# Patient Record
Sex: Male | Born: 1985 | Race: White | Hispanic: No | Marital: Single | Smoking: Never smoker
Health system: Southern US, Community
[De-identification: ages and names within clinical notes are randomized; demographics above are authoritative.]

## PROBLEM LIST (undated history)

## (undated) HISTORY — PX: NO PAST SURGERIES: SHX2092

---

## 2007-04-23 ENCOUNTER — Ambulatory Visit: Payer: Self-pay | Admitting: Internal Medicine

## 2017-04-20 ENCOUNTER — Ambulatory Visit
Admission: EM | Admit: 2017-04-20 | Discharge: 2017-04-20 | Disposition: A | Discharge status: Home / Self Care | Payer: Self-pay | Attending: Family Medicine | Admitting: Family Medicine

## 2017-04-20 DIAGNOSIS — M25512 Pain in left shoulder: Secondary | ICD-10-CM

## 2017-04-20 MED ORDER — KETOROLAC TROMETHAMINE 60 MG/2ML IM SOLN
60.0000 mg | Freq: Once | INTRAMUSCULAR | Status: AC
  Administered 2017-04-20: 60 mg via INTRAMUSCULAR

## 2017-04-20 MED ORDER — KETOROLAC TROMETHAMINE 10 MG PO TABS: 10.0000 mg | ORAL_TABLET | Freq: Three times a day (TID) | ORAL | 0 refills | Status: AC | PRN

## 2017-04-20 NOTE — ED Triage Notes (Signed)
Pt reports hx of LEFT shoulder fx several years ago. Pt states the lst 2 weeks he has been experiencing increased pain in shoulder that radiates to collar bone.

## 2017-04-20 NOTE — ED Provider Notes (Signed)
[facility]    CSN: [id number] Arrival date & time: 04/20/17  1038     History   Chief Complaint Chief Complaint  Patient presents with  . Shoulder Pain    HPI Bobby Butler is a 31 y.o. male.   31 yo male with a c/o left shoulder pain over the last 2 weeks. Denies any recent injuries, fevers, chills, redness. Has a history of a left humerus fracture one year ago.     The history is provided by the patient.  Shoulder Pain    History reviewed. No pertinent past medical history.  There are no active problems to display for this patient.   History reviewed. No pertinent surgical history.     Home Medications    Prior to Admission medications   Medication Sig Start Date End Date Taking? Authorizing Provider  ketorolac (TORADOL) 10 MG tablet Take 1 tablet (10 mg total) by mouth every 8 (eight) hours as needed. 04/20/17   Payton Mccallumonty, Georgena Weisheit, MD    Family History No family history on file.  Social History Social History  Substance Use Topics  . Smoking status: Never Smoker  . Smokeless tobacco: Current User  . Alcohol use Yes     Allergies   Patient has no known allergies.   Review of Systems Review of Systems   Physical Exam Triage Vital Signs ED Triage Vitals  Enc Vitals Group     BP 04/20/17 1138 (!) 123/56     Pulse Rate 04/20/17 1138 (!) 59     Resp 04/20/17 1138 16     Temp 04/20/17 1138 97.7 F (36.5 C)     Temp Source 04/20/17 1138 Oral     SpO2 04/20/17 1138 100 %     Weight 04/20/17 1141 215 lb (97.5 kg)     Height 04/20/17 1141 6' (1.829 m)     Head Circumference --      Peak Flow --      Pain Score 04/20/17 1141 8     Pain Loc --      Pain Edu? --      Excl. in GC? --    No data found.   Updated Vital Signs BP (!) 123/56 (BP Location: Left Arm)   Pulse (!) 59   Temp 97.7 F (36.5 C) (Oral)   Resp 16   Ht 6' (1.829 m)   Wt 215 lb (97.5 kg)   SpO2 100%   BMI 29.16 kg/m   Visual Acuity Right Eye Distance:    Left Eye Distance:   Bilateral Distance:    Right Eye Near:   Left Eye Near:    Bilateral Near:     Physical Exam  Constitutional: He appears well-developed and well-nourished. No distress.  Musculoskeletal:       Left shoulder: He exhibits tenderness (over the deltoid ). He exhibits normal range of motion, no bony tenderness, no swelling, no effusion, no crepitus, no deformity, no laceration, no pain, no spasm, normal pulse and normal strength.  Skin: He is not diaphoretic.  Nursing note and vitals reviewed.    UC Treatments / Results  Labs (all labs ordered are listed, but only abnormal results are displayed) Labs Reviewed - No data to display  EKG  EKG Interpretation None       Radiology No results found.  Procedures Procedures (including critical care time)  Medications Ordered in UC Medications  ketorolac (TORADOL) injection 60 mg (60 mg Intramuscular Given 04/20/17 1319)  Initial Impression / Assessment and Plan / UC Course  I have reviewed the triage vital signs and the nursing notes.  Pertinent labs & imaging results that were available during my care of the patient were reviewed by me and considered in my medical decision making (see chart for details).       Final Clinical Impressions(s) / UC Diagnoses   Final diagnoses:  Acute pain of left shoulder    New Prescriptions Discharge Medication List as of 04/20/2017  1:24 PM    START taking these medications   Details  ketorolac (TORADOL) 10 MG tablet Take 1 tablet (10 mg total) by mouth every 8 (eight) hours as needed., Starting Wed 04/20/2017, Normal       1.  diagnosis reviewed with patient 2. Patient given toradol  IM x1 with improvement of symptoms 3. rx as per orders above; reviewed possible side effects, interactions, risks and benefits  3. Recommend supportive treatment with ice 4. Follow-up prn if symptoms worsen or don't improve  Controlled Substance Prescriptions [place]  Controlled Substance Registry consulted? Not Applicable   Payton Mccallum, MD 04/20/17 [id number]

## 2019-03-29 ENCOUNTER — Other Ambulatory Visit: Payer: Self-pay | Admitting: Family Medicine

## 2019-03-29 DIAGNOSIS — T7500XA Unspecified effects of lightning, initial encounter: Secondary | ICD-10-CM

## 2019-03-29 DIAGNOSIS — R198 Other specified symptoms and signs involving the digestive system and abdomen: Secondary | ICD-10-CM

## 2019-03-29 DIAGNOSIS — R202 Paresthesia of skin: Secondary | ICD-10-CM

## 2019-04-02 ENCOUNTER — Ambulatory Visit: Admission: RE | Admit: 2019-04-02 | Payer: Self-pay | Source: Ambulatory Visit

## 2019-04-02 ENCOUNTER — Ambulatory Visit: Payer: Self-pay

## 2019-04-09 ENCOUNTER — Other Ambulatory Visit: Payer: Self-pay | Admitting: Surgery

## 2019-04-09 DIAGNOSIS — M25551 Pain in right hip: Secondary | ICD-10-CM

## 2019-04-09 DIAGNOSIS — M25859 Other specified joint disorders, unspecified hip: Secondary | ICD-10-CM

## 2019-04-23 ENCOUNTER — Other Ambulatory Visit: Payer: Self-pay | Admitting: Surgery

## 2019-04-23 ENCOUNTER — Ambulatory Visit
Admission: RE | Admit: 2019-04-23 | Discharge: 2019-04-23 | Disposition: A | Discharge status: Home / Self Care | Payer: BC Managed Care – PPO | Source: Ambulatory Visit | Attending: Surgery | Admitting: Surgery

## 2019-04-23 ENCOUNTER — Other Ambulatory Visit: Payer: Self-pay

## 2019-04-23 ENCOUNTER — Ambulatory Visit: Payer: BC Managed Care – PPO

## 2019-04-23 DIAGNOSIS — M25551 Pain in right hip: Secondary | ICD-10-CM

## 2019-04-23 DIAGNOSIS — M25859 Other specified joint disorders, unspecified hip: Secondary | ICD-10-CM | POA: Diagnosis not present

## 2019-04-23 MED ORDER — IOHEXOL 180 MG/ML  SOLN
15.0000 mL | Freq: Once | INTRAMUSCULAR | Status: AC | PRN
  Administered 2019-04-23: 15 mL

## 2019-04-23 MED ORDER — GADOBUTROL 1 MMOL/ML IV SOLN
0.0500 mL | Freq: Once | INTRAVENOUS | Status: AC | PRN
  Administered 2019-04-23: 0.05 mL

## 2019-04-23 MED ORDER — LIDOCAINE HCL (PF) 1 % IJ SOLN
5.0000 mL | Freq: Once | INTRAMUSCULAR | Status: AC
  Administered 2019-04-23: 11:00:00 5 mL
  Filled 2019-04-23: qty 5

## 2019-04-23 MED ORDER — SODIUM CHLORIDE (PF) 0.9 % IJ SOLN
15.0000 mL | Freq: Once | INTRAMUSCULAR | Status: AC
  Administered 2019-04-23: 15 mL

## 2019-05-04 ENCOUNTER — Other Ambulatory Visit: Payer: Self-pay

## 2019-05-04 ENCOUNTER — Ambulatory Visit
Admission: EM | Admit: 2019-05-04 | Discharge: 2019-05-04 | Disposition: A | Discharge status: Home / Self Care | Payer: BC Managed Care – PPO | Attending: Family Medicine | Admitting: Family Medicine

## 2019-05-04 ENCOUNTER — Encounter: Payer: Self-pay | Admitting: Emergency Medicine

## 2019-05-04 DIAGNOSIS — M25551 Pain in right hip: Secondary | ICD-10-CM

## 2019-05-04 MED ORDER — HYDROCODONE-ACETAMINOPHEN 5-325 MG PO TABS: ORAL_TABLET | ORAL | 0 refills | Status: AC

## 2019-05-04 MED ORDER — PREDNISONE 10 MG PO TABS: ORAL_TABLET | ORAL | 0 refills | Status: AC

## 2019-05-04 NOTE — ED Triage Notes (Signed)
Pt c/o right hip pain. He has known hip problems with this hip but pain is worse. He had a cortisone injection last Thursday but only had relief for about 45 minutes. He states that the pain runs down his leg and into his foot today. He has been taking Tylenol and Ibuprofen and is not helping.

## 2019-05-04 NOTE — Discharge Instructions (Signed)
Follow-up with orthopedist

## 2019-05-04 NOTE — ED Provider Notes (Signed)
[facility]    CSN: [id number] Arrival date & time: 05/04/19  1157      History   Chief Complaint Chief Complaint  Patient presents with  . Hip Pain    right    HPI Bobby Butler is a 33 y.o. male.   33 yo male with a c/o worsening acute pain of his chronic right hip pain problem. States he has "a cartilage problem" with his right hip and is being followed by orthopedist, however pain has worsened over the last 2 days. Denies any new injury, fevers, chills, rash. States he called his orthopedist but hasn't heard back from their office. Patient states he had a cortisone shot in the joint about 1 week ago but that it didn't seem to help much.    Hip Pain    History reviewed. No pertinent past medical history.  There are no active problems to display for this patient.   Past Surgical History:  Procedure Laterality Date  . NO PAST SURGERIES         Home Medications    Prior to Admission medications   Medication Sig Start Date End Date Taking? Authorizing Provider  HYDROcodone-acetaminophen (NORCO/VICODIN) 5-325 MG tablet 1-2 tabs po bid prn 05/04/19   Payton Mccallum, MD  ketorolac (TORADOL) 10 MG tablet Take 1 tablet (10 mg total) by mouth every 8 (eight) hours as needed. 04/20/17   Payton Mccallum, MD  predniSONE (DELTASONE) 10 MG tablet Start 60 mg po day one, then 50 mg po day two, taper by 10 mg daily until complete. 05/04/19   Payton Mccallum, MD    Family History Family History  Problem Relation Age of Onset  . Healthy Mother   . Healthy Father     Social History Social History   Tobacco Use  . Smoking status: Never Smoker  . Smokeless tobacco: Current User  Substance Use Topics  . Alcohol use: Yes  . Drug use: No     Allergies   Patient has no known allergies.   Review of Systems Review of Systems   Physical Exam Triage Vital Signs ED Triage Vitals  Enc Vitals Group     BP 05/04/19 1217 124/76     Pulse Rate 05/04/19 1217  71     Resp 05/04/19 1217 18     Temp 05/04/19 1217 98 F (36.7 C)     Temp Source 05/04/19 1217 Oral     SpO2 05/04/19 1217 98 %     Weight 05/04/19 1214 208 lb (94.3 kg)     Height 05/04/19 1214 5\' 10"  (1.778 m)     Head Circumference --      Peak Flow --      Pain Score 05/04/19 1213 10     Pain Loc --      Pain Edu? --      Excl. in GC? --    No data found.  Updated Vital Signs BP 124/76 (BP Location: Left Arm)   Pulse 71   Temp 98 F (36.7 C) (Oral)   Resp 18   Ht 5\' 10"  (1.778 m)   Wt 94.3 kg   SpO2 98%   BMI 29.84 kg/m   Visual Acuity Right Eye Distance:   Left Eye Distance:   Bilateral Distance:    Right Eye Near:   Left Eye Near:    Bilateral Near:     Physical Exam Vitals signs and nursing note reviewed.  Constitutional:  General: He is not in acute distress.    Appearance: He is not toxic-appearing.  Musculoskeletal:     Right hip: He exhibits no bony tenderness and no swelling.  Neurological:     Mental Status: He is alert.      UC Treatments / Results  Labs (all labs ordered are listed, but only abnormal results are displayed) Labs Reviewed - No data to display  EKG   Radiology No results found.  Procedures Procedures (including critical care time)  Medications Ordered in UC Medications - No data to display  Initial Impression / Assessment and Plan / UC Course  I have reviewed the triage vital signs and the nursing notes.  Pertinent labs & imaging results that were available during my care of the patient were reviewed by me and considered in my medical decision making (see chart for details).     Final Clinical Impressions(s) / UC Diagnoses   Final diagnoses:  Right hip pain     Discharge Instructions     Follow up with orthopedist    ED Prescriptions    Medication Sig Dispense Auth. Provider   predniSONE (DELTASONE) 10 MG tablet Start 60 mg po day one, then 50 mg po day two, taper by 10 mg daily until  complete. 21 tablet Norval Gable, MD   HYDROcodone-acetaminophen (NORCO/VICODIN) 5-325 MG tablet 1-2 tabs po bid prn 10 tablet Norval Gable, MD      1. diagnosis reviewed with patient 2. rx as per orders above; reviewed possible side effects, interactions, risks and benefits  3. Follow up with orthopedist  I have reviewed the PDMP during this encounter.   Norval Gable, MD 05/04/19 1256

## 2019-05-10 ENCOUNTER — Other Ambulatory Visit: Payer: Self-pay | Admitting: Orthopedic Surgery

## 2019-05-10 DIAGNOSIS — M25851 Other specified joint disorders, right hip: Secondary | ICD-10-CM

## 2020-01-15 IMAGING — RF DG FLUORO GUIDE NDL PLC/BX
1 series · 1 of 1 positions shown · non-contrast
Comparison: none

CLINICAL DATA: Generalized right hip pain for 15 years.

[Series 1: cp_standard · 0.27mm/px · 1 of 1 slices shown]
[im 1/1]
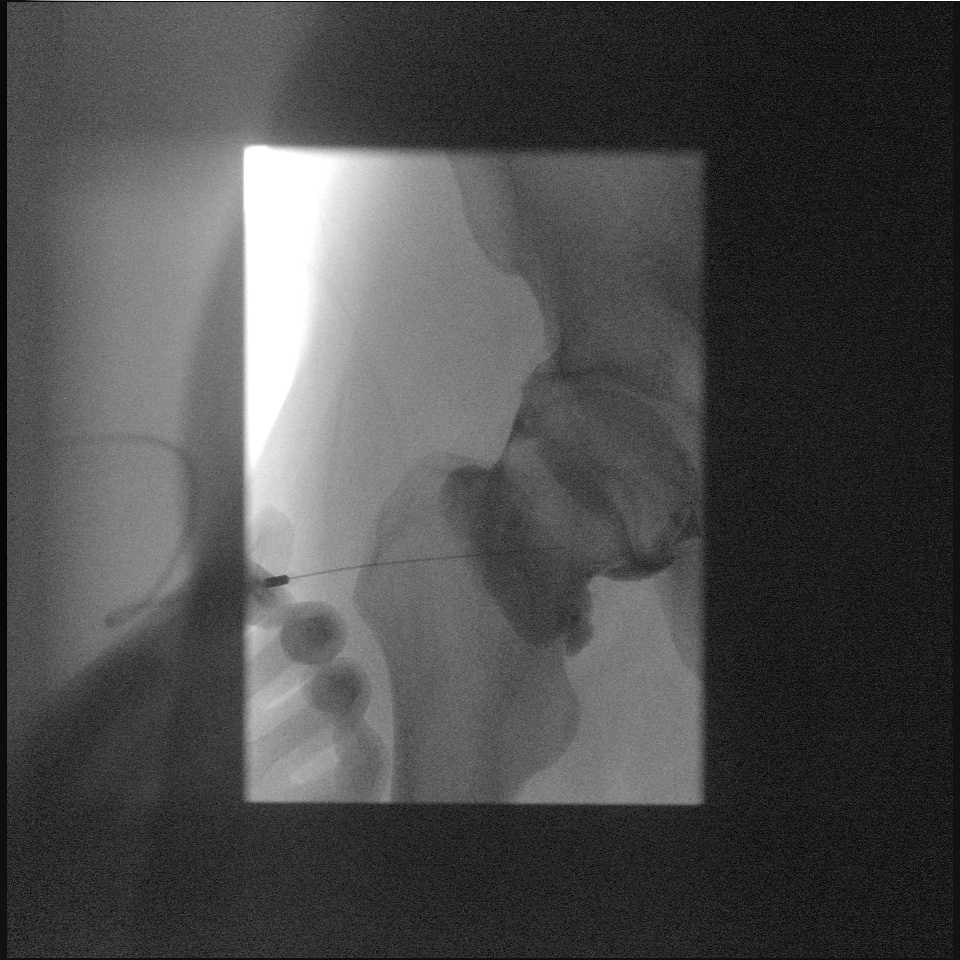

[1 of 1 positions shown; findings below may reference images not displayed]

EXAM:
RIGHT HIP ARTHROGRAM UNDER FLUOROSCOPY

FLUOROSCOPY TIME:  Fluoroscopy Time:  0.7 minute

Radiation Exposure Index (if provided by the fluoroscopic device):
5.3 mGy

Number of Acquired Spot Images: 0

PROCEDURE:
The risks and benefits of the procedure were discussed with the
patient, and written informed consent was obtained. The patient
stated no history of allergy to contrast media. A formal timeout
procedure was performed with the patient according to departmental
protocol.

The patient was placed supine on the fluoroscopy table and the right
hip joint was identified under fluoroscopy. The skin overlying the
right hip joint was subsequently cleaned with Chloraprep and a
sterile drape was placed over the area of interest. 5 ml 1%
Lidocaine was used to anesthetize the skin around the needle
insertion site.

A 22 gauge spinal needle was inserted into the right hip joint under
fluoroscopy. Position was confirmed with injection of less than 1ml
of Omnipaque 180 under fluoroscopy.

12 ml of gadolinium mixture (0.05 ml of Gadavist mixed with 10 ml of
sterile saline and 10 mL Omnipaque 180) was injected into the left
glenohumeral joint.

The needle was removed and hemostasis was achieved. The patient was
subsequently transferred to MRI for imaging.
IMPRESSION: Successful right hip arthrogram under fluoroscopy prior to MRI.

## 2023-03-22 ENCOUNTER — Other Ambulatory Visit: Payer: Self-pay | Admitting: Family Medicine

## 2023-03-22 DIAGNOSIS — R600 Localized edema: Secondary | ICD-10-CM

## 2023-03-24 ENCOUNTER — Ambulatory Visit
Admission: RE | Admit: 2023-03-24 | Discharge: 2023-03-24 | Disposition: A | Discharge status: Home / Self Care | Payer: BC Managed Care – PPO | Source: Ambulatory Visit | Attending: Family Medicine | Admitting: Family Medicine

## 2023-03-24 DIAGNOSIS — R600 Localized edema: Secondary | ICD-10-CM | POA: Diagnosis present
